# Patient Record
Sex: Male | Born: 1985 | Race: Black or African American | Hispanic: No | Marital: Single | Smoking: Current every day smoker
Health system: Southern US, Community
[De-identification: ages and names within clinical notes are randomized; demographics above are authoritative.]

---

## 2015-02-20 ENCOUNTER — Emergency Department (HOSPITAL_COMMUNITY)
Admission: EM | Admit: 2015-02-20 | Discharge: 2015-02-20 | Disposition: A | Discharge status: Home / Self Care | Payer: BLUE CROSS/BLUE SHIELD | Attending: Emergency Medicine | Admitting: Emergency Medicine

## 2015-02-20 ENCOUNTER — Encounter (HOSPITAL_COMMUNITY): Payer: Self-pay | Admitting: Emergency Medicine

## 2015-02-20 DIAGNOSIS — R000 Tachycardia, unspecified: Secondary | ICD-10-CM | POA: Insufficient documentation

## 2015-02-20 DIAGNOSIS — J069 Acute upper respiratory infection, unspecified: Secondary | ICD-10-CM | POA: Insufficient documentation

## 2015-02-20 DIAGNOSIS — J029 Acute pharyngitis, unspecified: Secondary | ICD-10-CM | POA: Diagnosis present

## 2015-02-20 DIAGNOSIS — Z720 Tobacco use: Secondary | ICD-10-CM | POA: Insufficient documentation

## 2015-02-20 DIAGNOSIS — R6889 Other general symptoms and signs: Secondary | ICD-10-CM

## 2015-02-20 MED ORDER — ACETAMINOPHEN 500 MG PO TABS
1000.0000 mg | ORAL_TABLET | Freq: Once | ORAL | Status: AC
  Administered 2015-02-20: 1000 mg via ORAL
  Filled 2015-02-20: qty 2

## 2015-02-20 MED ORDER — FLUTICASONE PROPIONATE 50 MCG/ACT NA SUSP: 2.0000 | Freq: Every day | NASAL | Status: DC

## 2015-02-20 NOTE — ED Provider Notes (Signed)
CSN: [id number]     Arrival date & time 02/20/15  1048 History   First MD Initiated Contact with Patient 02/20/15 1058     Chief Complaint  Patient presents with  . Cough  . Sore Throat     (Consider location/radiation/quality/duration/timing/severity/associated sxs/prior Treatment) HPI Comments: 29 year old male presenting with productive cough, initially with clear mucus, occasional blood-tinged when he coughs "really hard", sore throat, body aches and pains, and congestion 2 days. Endorses subjective fever and chills at home. Has a decreased appetite. Denies nausea, vomiting or diarrhea. His daughter is sick with similar symptoms and was recently diagnosed with a URI. He has been taking Tylenol for the fevers which have been helping.  Patient is a 29 y.o. male presenting with cough and pharyngitis. The history is provided by the patient.  Cough Associated symptoms: chills, fever, myalgias, rhinorrhea and sore throat   Sore Throat Associated symptoms include arthralgias, chills, congestion, coughing, a fever, myalgias and a sore throat.    History reviewed. No pertinent past medical history. History reviewed. No pertinent past surgical history. History reviewed. No pertinent family history. History  Substance Use Topics  . Smoking status: Current Every Day Smoker  . Smokeless tobacco: Not on file  . Alcohol Use: No    Review of Systems  Constitutional: Positive for fever, chills and appetite change.  HENT: Positive for congestion, rhinorrhea, sinus pressure and sore throat.   Respiratory: Positive for cough.   Musculoskeletal: Positive for myalgias and arthralgias.  All other systems reviewed and are negative.     Allergies  Review of patient's allergies indicates no known allergies.  Home Medications   Prior to Admission medications   Medication Sig Start Date End Date Taking? Authorizing Provider  fluticasone (FLONASE) 50 MCG/ACT nasal spray Place 2 sprays into  both nostrils daily. 02/20/15   Mukesh Kornegay M Harlow Carrizales, PA-C   BP 113/72 mmHg  Pulse 96  Temp(Src) 99 F (37.2 C) (Oral)  Resp 18  SpO2 96% Physical Exam  Constitutional: He is oriented to person, place, and time. He appears well-developed and well-nourished. No distress.  HENT:  Head: Normocephalic and atraumatic.  Right Ear: Tympanic membrane normal.  Left Ear: Tympanic membrane normal.  Nasal congestion, mucosal edema, postnasal drip. No post oropharyngeal edema or exudate. Uvula midline.  Eyes: Conjunctivae and EOM are normal.  Neck: Normal range of motion. Neck supple.  No meningeal signs.  Cardiovascular: Regular rhythm and normal heart sounds.   Mild tachy.  Pulmonary/Chest: Effort normal and breath sounds normal.  Abdominal: Soft. Bowel sounds are normal. He exhibits no distension.  Musculoskeletal: Normal range of motion. He exhibits no edema.  Lymphadenopathy:    He has no cervical adenopathy.  Neurological: He is alert and oriented to person, place, and time.  Skin: Skin is warm and dry.  Psychiatric: He has a normal mood and affect. His behavior is normal.  Nursing note and vitals reviewed.   ED Course  Procedures (including critical care time) Labs Review Labs Reviewed - No data to display  Imaging Review No results found.   EKG Interpretation None      MDM   Final diagnoses:  URI (upper respiratory infection)  Flu-like symptoms   NAD. Non-toxic appearing. Temperature 100.6, mildly tachycardic on arrival, vitals otherwise stable. Lungs clear. No meningeal signs. Tylenol given for fever. Discussed symptomatic treatment. Vitals improved after tylenol. Stable for d/c. Return precautions given. Patient states understanding of treatment care plan and is agreeable.  Kathrynn Speedobyn M Wilbern Pennypacker,  PA-C 02/20/15 1251  Nelva Nay, MD 02/21/15 [phone]

## 2015-02-20 NOTE — Discharge Instructions (Signed)
Use flonase as prescribed. Rest and stay well hydrated.  Cool Mist Vaporizers Vaporizers may help relieve the symptoms of a cough and cold. They add moisture to the air, which helps mucus to become thinner and less sticky. This makes it easier to breathe and cough up secretions. Cool mist vaporizers do not cause serious burns like hot mist vaporizers, which may also be called steamers or humidifiers. Vaporizers have not been proven to help with colds. You should not use a vaporizer if you are allergic to mold. HOME CARE INSTRUCTIONS  Follow the package instructions for the vaporizer.  Do not use anything other than distilled water in the vaporizer.  Do not run the vaporizer all of the time. This can cause mold or bacteria to grow in the vaporizer.  Clean the vaporizer after each time it is used.  Clean and dry the vaporizer well before storing it.  Stop using the vaporizer if worsening respiratory symptoms develop. Document Released: 08/09/2004 Document Revised: 11/17/2013 Document Reviewed: 04/01/2013 [facility] Patient Information 2015 [employer], [place]. This information is not intended to replace advice given to you by your health care provider. Make sure you discuss any questions you have with your health care provider.  Upper Respiratory Infection, Adult An upper respiratory infection (URI) is also sometimes known as the common cold. The upper respiratory tract includes the nose, sinuses, throat, trachea, and bronchi. Bronchi are the airways leading to the lungs. Most people improve within 1 week, but symptoms can last up to 2 weeks. A residual cough may last even longer.  CAUSES Many different viruses can infect the tissues lining the upper respiratory tract. The tissues become irritated and inflamed and often become very moist. Mucus production is also common. A cold is contagious. You can easily spread the virus to others by oral contact. This includes kissing, sharing a glass, coughing,  or sneezing. Touching your mouth or nose and then touching a surface, which is then touched by another person, can also spread the virus. SYMPTOMS  Symptoms typically develop 1 to 3 days after you come in contact with a cold virus. Symptoms vary from person to person. They may include:  Runny nose.  Sneezing.  Nasal congestion.  Sinus irritation.  Sore throat.  Loss of voice (laryngitis).  Cough.  Fatigue.  Muscle aches.  Loss of appetite.  Headache.  Low-grade fever. DIAGNOSIS  You might diagnose your own cold based on familiar symptoms, since most people get a cold 2 to 3 times a year. Your caregiver can confirm this based on your exam. Most importantly, your caregiver can check that your symptoms are not due to another disease such as strep throat, sinusitis, pneumonia, asthma, or epiglottitis. Blood tests, throat tests, and X-rays are not necessary to diagnose a common cold, but they may sometimes be helpful in excluding other more serious diseases. Your caregiver will decide if any further tests are required. RISKS AND COMPLICATIONS  You may be at risk for a more severe case of the common cold if you smoke cigarettes, have chronic heart disease (such as heart failure) or lung disease (such as asthma), or if you have a weakened immune system. The very young and very old are also at risk for more serious infections. Bacterial sinusitis, middle ear infections, and bacterial pneumonia can complicate the common cold. The common cold can worsen asthma and chronic obstructive pulmonary disease (COPD). Sometimes, these complications can require emergency medical care and may be life-threatening. PREVENTION  The best way to  protect against getting a cold is to practice good hygiene. Avoid oral or hand contact with people with cold symptoms. Wash your hands often if contact occurs. There is no clear evidence that vitamin C, vitamin E, echinacea, or exercise reduces the chance of developing  a cold. However, it is always recommended to get plenty of rest and practice good nutrition. TREATMENT  Treatment is directed at relieving symptoms. There is no cure. Antibiotics are not effective, because the infection is caused by a virus, not by bacteria. Treatment may include:  Increased fluid intake. Sports drinks offer valuable electrolytes, sugars, and fluids.  Breathing heated mist or steam (vaporizer or shower).  Eating chicken soup or other clear broths, and maintaining good nutrition.  Getting plenty of rest.  Using gargles or lozenges for comfort.  Controlling fevers with ibuprofen or acetaminophen as directed by your caregiver.  Increasing usage of your inhaler if you have asthma. Zinc gel and zinc lozenges, taken in the first 24 hours of the common cold, can shorten the duration and lessen the severity of symptoms. Pain medicines may help with fever, muscle aches, and throat pain. A variety of non-prescription medicines are available to treat congestion and runny nose. Your caregiver can make recommendations and may suggest nasal or lung inhalers for other symptoms.  HOME CARE INSTRUCTIONS   Only take over-the-counter or prescription medicines for pain, discomfort, or fever as directed by your caregiver.  Use a warm mist humidifier or inhale steam from a shower to increase air moisture. This may keep secretions moist and make it easier to breathe.  Drink enough water and fluids to keep your urine clear or pale yellow.  Rest as needed.  Return to work when your temperature has returned to normal or as your caregiver advises. You may need to stay home longer to avoid infecting others. You can also use a face mask and careful hand washing to prevent spread of the virus. SEEK MEDICAL CARE IF:   After the first few days, you feel you are getting worse rather than better.  You need your caregiver's advice about medicines to control symptoms.  You develop chills, worsening  shortness of breath, or brown or red sputum. These may be signs of pneumonia.  You develop yellow or brown nasal discharge or pain in the face, especially when you bend forward. These may be signs of sinusitis.  You develop a fever, swollen neck glands, pain with swallowing, or white areas in the back of your throat. These may be signs of strep throat. SEEK IMMEDIATE MEDICAL CARE IF:   You have a fever.  You develop severe or persistent headache, ear pain, sinus pain, or chest pain.  You develop wheezing, a prolonged cough, cough up blood, or have a change in your usual mucus (if you have chronic lung disease).  You develop sore muscles or a stiff neck. Document Released: 05/08/2001 Document Revised: 02/04/2012 Document Reviewed: 02/17/2014 [facility] Patient Information 2015 [place], [place]. This information is not intended to replace advice given to you by your health care provider. Make sure you discuss any questions you have with your health care provider.  Influenza Influenza ("the flu") is a viral infection of the respiratory tract. It occurs more often in winter months because people spend more time in close contact with one another. Influenza can make you feel very sick. Influenza easily spreads from person to person (contagious). CAUSES  Influenza is caused by a virus that infects the respiratory tract. You can catch  the virus by breathing in droplets from an infected person's cough or sneeze. You can also catch the virus by touching something that was recently contaminated with the virus and then touching your mouth, nose, or eyes. RISKS AND COMPLICATIONS You may be at risk for a more severe case of influenza if you smoke cigarettes, have diabetes, have chronic heart disease (such as heart failure) or lung disease (such as asthma), or if you have a weakened immune system. Elderly people and pregnant women are also at risk for more serious infections. The most common problem of influenza  is a lung infection (pneumonia). Sometimes, this problem can require emergency medical care and may be life threatening. SIGNS AND SYMPTOMS  Symptoms typically last 4 to 10 days and may include:  Fever.  Chills.  Headache, body aches, and muscle aches.  Sore throat.  Chest discomfort and cough.  Poor appetite.  Weakness or feeling tired.  Dizziness.  Nausea or vomiting. DIAGNOSIS  Diagnosis of influenza is often made based on your history and a physical exam. A nose or throat swab test can be done to confirm the diagnosis. TREATMENT  In mild cases, influenza goes away on its own. Treatment is directed at relieving symptoms. For more severe cases, your health care provider may prescribe antiviral medicines to shorten the sickness. Antibiotic medicines are not effective because the infection is caused by a virus, not by bacteria. HOME CARE INSTRUCTIONS  Take medicines only as directed by your health care provider.  Use a cool mist humidifier to make breathing easier.  Get plenty of rest until your temperature returns to normal. This usually takes 3 to 4 days.  Drink enough fluid to keep your urine clear or pale yellow.  Cover yourmouth and nosewhen coughing or sneezing,and wash your handswellto prevent thevirusfrom spreading.  Stay homefromwork orschool untilthe fever is gonefor at least 13full day. PREVENTION  An annual influenza vaccination (flu shot) is the best way to avoid getting influenza. An annual flu shot is now routinely recommended for all adults in the [place] SEEK MEDICAL CARE IF:  You experiencechest pain, yourcough worsens,or you producemore mucus.  Youhave nausea,vomiting, ordiarrhea.  Your fever returns or gets worse. SEEK IMMEDIATE MEDICAL CARE IF:  You havetrouble breathing, you become short of breath,or your skin ornails becomebluish.  You have severe painor stiffnessin the neck.  You develop a sudden headache, or pain in  the face or ear.  You have nausea or vomiting that you cannot control. MAKE SURE YOU:   Understand these instructions.  Will watch your condition.  Will get help right away if you are not doing well or get worse. Document Released: 11/09/2000 Document Revised: 03/29/2014 Document Reviewed: 02/11/2012 [facility] Patient Information 2015 [place], [place]. This information is not intended to replace advice given to you by your health care provider. Make sure you discuss any questions you have with your health care provider.

## 2015-02-20 NOTE — ED Notes (Signed)
Pt c/o cough, sore throat, body aches since Sunday.  Subjective fever at home.

## 2017-01-16 ENCOUNTER — Encounter: Payer: BLUE CROSS/BLUE SHIELD | Admitting: Podiatry

## 2017-03-25 NOTE — Progress Notes (Signed)
This encounter was created in error - please disregard.

## 2019-12-10 ENCOUNTER — Other Ambulatory Visit: Payer: Self-pay

## 2019-12-10 ENCOUNTER — Ambulatory Visit (HOSPITAL_COMMUNITY)
Admission: EM | Admit: 2019-12-10 | Discharge: 2019-12-10 | Disposition: A | Discharge status: Home / Self Care | Payer: BC Managed Care – PPO | Attending: Internal Medicine | Admitting: Internal Medicine

## 2019-12-10 ENCOUNTER — Encounter (HOSPITAL_COMMUNITY): Payer: Self-pay

## 2019-12-10 DIAGNOSIS — Z9189 Other specified personal risk factors, not elsewhere classified: Secondary | ICD-10-CM

## 2019-12-10 DIAGNOSIS — Z20822 Contact with and (suspected) exposure to covid-19: Secondary | ICD-10-CM | POA: Insufficient documentation

## 2019-12-10 DIAGNOSIS — Z20828 Contact with and (suspected) exposure to other viral communicable diseases: Secondary | ICD-10-CM

## 2019-12-10 DIAGNOSIS — Z79899 Other long term (current) drug therapy: Secondary | ICD-10-CM | POA: Insufficient documentation

## 2019-12-10 DIAGNOSIS — F17200 Nicotine dependence, unspecified, uncomplicated: Secondary | ICD-10-CM | POA: Insufficient documentation

## 2019-12-10 NOTE — ED Triage Notes (Signed)
Pt presents for covid testing after possible exposure: pt states his mom is symptomatic and has pending test results so his job is making him get tested and has him out of work pending test results.

## 2019-12-10 NOTE — ED Provider Notes (Signed)
[place]    CSN: [id number] Arrival date & time: 12/10/19  0855      History   Chief Complaint Chief Complaint  Patient presents with  . Covid Exposure    HPI Troy Shields is a 34 y.o. male with no past medical history comes to urgent care for COVID-19 testing.  Patient's mother developed the loss of sense of smell and taste.  She is currently being investigated for COVID-19 infection.  Patient has no symptoms.   HPI  History reviewed. No pertinent past medical history.  There are no problems to display for this patient.   History reviewed. No pertinent surgical history.     Home Medications    Prior to Admission medications   Medication Sig Start Date End Date Taking? Authorizing Provider  fluticasone (FLONASE) 50 MCG/ACT nasal spray Place 2 sprays into both nostrils daily. 02/20/15   Hess, Hessie Diener, PA-C    Family History Family History  Family history unknown: Yes    Social History Social History   Tobacco Use  . Smoking status: Current Every Day Smoker  Substance Use Topics  . Alcohol use: No  . Drug use: No     Allergies   Patient has no known allergies.   Review of Systems Review of Systems  Constitutional: Negative for appetite change and fever.  Respiratory: Negative for cough and shortness of breath.   Musculoskeletal: Negative for arthralgias, joint swelling and myalgias.  Skin: Negative.      Physical Exam Triage Vital Signs ED Triage Vitals  Enc Vitals Group     BP 12/10/19 0914 (!) 147/96     Pulse Rate 12/10/19 0914 68     Resp 12/10/19 0914 18     Temp 12/10/19 0914 98 F (36.7 C)     Temp Source 12/10/19 0914 Oral     SpO2 12/10/19 0914 100 %     Weight --      Height --      Head Circumference --      Peak Flow --      Pain Score 12/10/19 0916 0     Pain Loc --      Pain Edu? --      Excl. in [place]? --    No data found.  Updated Vital Signs BP (!) 147/96 (BP Location: Left Arm)   Pulse 68   Temp 98  F (36.7 C) (Oral)   Resp 18   SpO2 100%   Visual Acuity Right Eye Distance:   Left Eye Distance:   Bilateral Distance:    Right Eye Near:   Left Eye Near:    Bilateral Near:     Physical Exam Vitals and nursing note reviewed.  Constitutional:      Appearance: Normal appearance.  Cardiovascular:     Rate and Rhythm: Normal rate and regular rhythm.     Pulses: Normal pulses.     Heart sounds: Normal heart sounds.  Neurological:     Mental Status: He is alert.      UC Treatments / Results  Labs (all labs ordered are listed, but only abnormal results are displayed) Labs Reviewed  NOVEL CORONAVIRUS, NAA (HOSP ORDER, SEND-OUT TO REF LAB; TAT 18-24 HRS)    EKG   Radiology No results found.  Procedures Procedures (including critical care time)  Medications Ordered in UC Medications - No data to display  Initial Impression / Assessment and Plan / UC Course  I have reviewed  the triage vital signs and the nursing notes.  Pertinent labs & imaging results that were available during my care of the patient were reviewed by me and considered in my medical decision making (see chart for details).     1.  Increased risk of exposure to COVID-19 virus: COVID-19 PCR testing sent Patient is encouraged to download MyChart to access his lab results Self isolate until COVID-19 test results are available Continue wearing a mask, respiratory etiquette and  hygiene measures, social distance. Final Clinical Impressions(s) / UC Diagnoses   Final diagnoses:  At increased risk of exposure to COVID-19 virus   Discharge Instructions   None    ED Prescriptions    None     PDMP not reviewed this encounter.   Merrilee Jansky, MD 12/10/19 1000

## 2019-12-12 LAB — NOVEL CORONAVIRUS, NAA (HOSP ORDER, SEND-OUT TO REF LAB; TAT 18-24 HRS): SARS-CoV-2, NAA: NOT DETECTED

## 2019-12-22 ENCOUNTER — Other Ambulatory Visit: Payer: Self-pay

## 2019-12-22 ENCOUNTER — Ambulatory Visit (HOSPITAL_COMMUNITY)
Admission: EM | Admit: 2019-12-22 | Discharge: 2019-12-22 | Disposition: A | Discharge status: Home / Self Care | Payer: BC Managed Care – PPO | Attending: Family Medicine | Admitting: Family Medicine

## 2019-12-22 ENCOUNTER — Encounter (HOSPITAL_COMMUNITY): Payer: Self-pay | Admitting: Emergency Medicine

## 2019-12-22 DIAGNOSIS — Z20822 Contact with and (suspected) exposure to covid-19: Secondary | ICD-10-CM | POA: Insufficient documentation

## 2019-12-22 DIAGNOSIS — R0989 Other specified symptoms and signs involving the circulatory and respiratory systems: Secondary | ICD-10-CM

## 2019-12-22 DIAGNOSIS — R05 Cough: Secondary | ICD-10-CM | POA: Insufficient documentation

## 2019-12-22 DIAGNOSIS — F17200 Nicotine dependence, unspecified, uncomplicated: Secondary | ICD-10-CM | POA: Diagnosis present

## 2019-12-22 DIAGNOSIS — R059 Cough, unspecified: Secondary | ICD-10-CM

## 2019-12-22 DIAGNOSIS — R0789 Other chest pain: Secondary | ICD-10-CM | POA: Insufficient documentation

## 2019-12-22 MED ORDER — PROMETHAZINE-DM 6.25-15 MG/5ML PO SYRP: 5.0000 mL | ORAL_SOLUTION | Freq: Every evening | ORAL | 0 refills | Status: DC | PRN

## 2019-12-22 MED ORDER — BENZONATATE 100 MG PO CAPS: 100.0000 mg | ORAL_CAPSULE | Freq: Three times a day (TID) | ORAL | 0 refills | Status: DC | PRN

## 2019-12-22 MED ORDER — ALBUTEROL SULFATE HFA 108 (90 BASE) MCG/ACT IN AERS: 1.0000 | INHALATION_SPRAY | Freq: Four times a day (QID) | RESPIRATORY_TRACT | 0 refills | Status: DC | PRN

## 2019-12-22 NOTE — ED Provider Notes (Signed)
  [facility]   MRN: [id number] DOB: 06/10/1986  Subjective:   Troy Shields is a 34 y.o. male presenting for 2-3 day history of acute onset persistent moderate malaise, chest congestion and heaviness. Patient smokes heavily, chain smokes. He has had COVID exposure through his mother, lives with her. She tested positive ~2 weeks ago. He tested at the same time and was negative. Currently, patient's employer has him out until 12/28/2019.   No current facility-administered medications for this encounter.  Current Outpatient Medications:  .  fluticasone (FLONASE) 50 MCG/ACT nasal spray, Place 2 sprays into both nostrils daily., Disp: 16 g, Rfl: 0   No Known Allergies  History reviewed. No pertinent past medical history.   History reviewed. No pertinent surgical history.  Family History  Family history unknown: Yes    Social History   Tobacco Use  . Smoking status: Current Every Day Smoker  . Smokeless tobacco: Never Used  Substance Use Topics  . Alcohol use: No  . Drug use: No    ROS   Objective:   Vitals: BP (!) 125/96 (BP Location: Right Arm)   Pulse 82   Temp 98.4 F (36.9 C) (Oral)   Resp 18   SpO2 97%   Physical Exam Constitutional:      General: He is not in acute distress.    Appearance: Normal appearance. He is well-developed. He is not ill-appearing, toxic-appearing or diaphoretic.  HENT:     Head: Normocephalic and atraumatic.     Right Ear: External ear normal.     Left Ear: External ear normal.     Nose: Nose normal.     Mouth/Throat:     Mouth: Mucous membranes are moist.     Pharynx: Oropharynx is clear.  Eyes:     General: No scleral icterus.    Extraocular Movements: Extraocular movements intact.     Pupils: Pupils are equal, round, and reactive to light.  Cardiovascular:     Rate and Rhythm: Normal rate and regular rhythm.     Heart sounds: Normal heart sounds. No murmur. No friction rub. No gallop.   Pulmonary:     Effort:  Pulmonary effort is normal. No respiratory distress.     Breath sounds: Normal breath sounds. No stridor. No wheezing, rhonchi or rales.  Neurological:     Mental Status: He is alert and oriented to person, place, and time.  Psychiatric:        Mood and Affect: Mood normal.        Behavior: Behavior normal.        Thought Content: Thought content normal.        Judgment: Judgment normal.     Assessment and Plan :   1. Chest congestion   2. Chest heaviness   3. Close exposure to COVID-19 virus   4. Cough   5. Chain smoker     Will manage for viral illness such as viral URI, viral rhinitis, possible COVID-19. Counseled patient on nature of COVID-19 including modes of transmission, diagnostic testing, management and supportive care.  Rx for cough suppression medications, albuterol inhaler. Hold off on smoking. COVID 19 testing is pending. Will extend quarantine period to 12/30/2019 if COVID 19 testing is positive. Counseled patient on potential for adverse effects with medications prescribed/recommended today, ER and return-to-clinic precautions discussed, patient verbalized understanding.     Wallis Bamberg, PA-C 12/22/19 1850

## 2019-12-22 NOTE — Discharge Instructions (Addendum)

## 2019-12-22 NOTE — ED Triage Notes (Signed)
Pt here with some nasal congestion and chest congestion; pt sts mother had covid recently and he was tested and was negative

## 2019-12-24 LAB — NOVEL CORONAVIRUS, NAA (HOSP ORDER, SEND-OUT TO REF LAB; TAT 18-24 HRS): SARS-CoV-2, NAA: NOT DETECTED

## 2020-03-02 ENCOUNTER — Ambulatory Visit (INDEPENDENT_AMBULATORY_CARE_PROVIDER_SITE_OTHER): Payer: BC Managed Care – PPO

## 2020-03-02 ENCOUNTER — Other Ambulatory Visit: Payer: Self-pay

## 2020-03-02 ENCOUNTER — Encounter (HOSPITAL_COMMUNITY): Payer: Self-pay

## 2020-03-02 ENCOUNTER — Ambulatory Visit (HOSPITAL_COMMUNITY)
Admission: EM | Admit: 2020-03-02 | Discharge: 2020-03-02 | Disposition: A | Discharge status: Home / Self Care | Payer: BC Managed Care – PPO | Attending: Family Medicine | Admitting: Family Medicine

## 2020-03-02 DIAGNOSIS — R05 Cough: Secondary | ICD-10-CM

## 2020-03-02 DIAGNOSIS — R531 Weakness: Secondary | ICD-10-CM

## 2020-03-02 DIAGNOSIS — R059 Cough, unspecified: Secondary | ICD-10-CM

## 2020-03-02 DIAGNOSIS — Z20822 Contact with and (suspected) exposure to covid-19: Secondary | ICD-10-CM | POA: Diagnosis not present

## 2020-03-02 DIAGNOSIS — Z79899 Other long term (current) drug therapy: Secondary | ICD-10-CM | POA: Diagnosis not present

## 2020-03-02 DIAGNOSIS — J3489 Other specified disorders of nose and nasal sinuses: Secondary | ICD-10-CM | POA: Insufficient documentation

## 2020-03-02 DIAGNOSIS — R062 Wheezing: Secondary | ICD-10-CM | POA: Insufficient documentation

## 2020-03-02 DIAGNOSIS — F17210 Nicotine dependence, cigarettes, uncomplicated: Secondary | ICD-10-CM | POA: Diagnosis not present

## 2020-03-02 DIAGNOSIS — R5383 Other fatigue: Secondary | ICD-10-CM | POA: Insufficient documentation

## 2020-03-02 DIAGNOSIS — J069 Acute upper respiratory infection, unspecified: Secondary | ICD-10-CM | POA: Diagnosis not present

## 2020-03-02 MED ORDER — ALBUTEROL SULFATE HFA 108 (90 BASE) MCG/ACT IN AERS: 1.0000 | INHALATION_SPRAY | Freq: Four times a day (QID) | RESPIRATORY_TRACT | 0 refills | Status: DC | PRN

## 2020-03-02 NOTE — ED Triage Notes (Signed)
Pt c/o dry cough when laying down, but sometimes has productive cough w/yellow/green mucous, runny nose, weaknessx1 wk. Pt did telehealth visit w/PCP and they feel like he has a lung infection and prescribed him an inhaler and meds, but told him to get a COVID test.

## 2020-03-02 NOTE — ED Provider Notes (Signed)
[place]    CSN: [id number] Arrival date & time: 03/02/20  1336      History   Chief Complaint Chief Complaint  Patient presents with  . COVID symptoms    HPI Troy Shields is a 34 y.o. male.   Patient reports that he has been having a cough, fatigue, runny nose, weakness for the last 7 days.  Reports that he did a televisit this morning, and that they recommended that he come in to get a Covid test.  Patient has already been prescribed doxycycline, cough syrup, Tessalon Perles, albuterol inhaler, steroid taper Dosepak from previous televisit.  Otherwise, patient takes no daily medications.  Upon chart review, patient has no significant medical history.  Denies headache, shortness of breath, body aches, chills, nausea, vomiting, diarrhea, rash, fever, other symptoms.  ROS per HPI  The history is provided by the patient.    History reviewed. No pertinent past medical history.  There are no problems to display for this patient.   History reviewed. No pertinent surgical history.     Home Medications    Prior to Admission medications   Medication Sig Start Date End Date Taking? Authorizing Provider  albuterol (VENTOLIN HFA) 108 (90 Base) MCG/ACT inhaler Inhale 1-2 puffs into the lungs every 6 (six) hours as needed for wheezing or shortness of breath. 03/02/20   Faustino Congress, NP  benzonatate (TESSALON) 100 MG capsule Take 1-2 capsules (100-200 mg total) by mouth 3 (three) times daily as needed. 12/22/19   Jaynee Eagles, PA-C  doxycycline (VIBRAMYCIN) 100 MG capsule Take 100 mg by mouth 2 (two) times daily. 02/29/20   [provider]  fluticasone (FLONASE) 50 MCG/ACT nasal spray Place 2 sprays into both nostrils daily. 02/20/15   Hess, Hessie Diener, PA-C  methylPREDNISolone (MEDROL) 4 MG tablet Take 4 mg by mouth as directed. 02/29/20   [provider]  promethazine-dextromethorphan (PROMETHAZINE-DM) 6.25-15 MG/5ML syrup Take 5 mLs by mouth at bedtime as  needed for cough. 12/22/19   Jaynee Eagles, PA-C    Family History Family History  Family history unknown: Yes    Social History Social History   Tobacco Use  . Smoking status: Current Every Day Smoker    Packs/day: 0.50    Types: Cigarettes  . Smokeless tobacco: Never Used  Substance Use Topics  . Alcohol use: Yes    Comment: occ  . Drug use: No     Allergies   Patient has no known allergies.   Review of Systems Review of Systems   Physical Exam Triage Vital Signs ED Triage Vitals  Enc Vitals Group     BP 03/02/20 1358 (!) 142/85     Pulse Rate 03/02/20 1358 67     Resp 03/02/20 1358 16     Temp 03/02/20 1358 98.1 F (36.7 C)     Temp Source 03/02/20 1358 Oral     SpO2 03/02/20 1358 100 %     Weight 03/02/20 1359 150 lb (68 kg)     Height 03/02/20 1359 6\' 1"  (1.854 m)     Head Circumference --      Peak Flow --      Pain Score 03/02/20 1359 0     Pain Loc --      Pain Edu? --      Excl. in [place]? --    No data found.  Updated Vital Signs BP (!) 142/85   Pulse 67   Temp 98.1 F (36.7 C) (Oral)  Resp 16   Ht 6\' 1"  (1.854 m)   Wt 150 lb (68 kg)   SpO2 100%   BMI 19.79 kg/m   Visual Acuity Right Eye Distance:   Left Eye Distance:   Bilateral Distance:    Right Eye Near:   Left Eye Near:    Bilateral Near:     Physical Exam Vitals and nursing note reviewed.  Constitutional:      Appearance: Normal appearance. He is well-developed and normal weight. He is ill-appearing.  HENT:     Head: Normocephalic and atraumatic.     Right Ear: Tympanic membrane normal.     Left Ear: Tympanic membrane normal.     Nose: Rhinorrhea present.     Mouth/Throat:     Mouth: Mucous membranes are moist.     Pharynx: Oropharynx is clear.  Eyes:     Conjunctiva/sclera: Conjunctivae normal.  Cardiovascular:     Rate and Rhythm: Normal rate and regular rhythm.     Heart sounds: Normal heart sounds. No murmur.  Pulmonary:     Effort: Pulmonary effort is normal.  No respiratory distress.     Breath sounds: No stridor. Examination of the right-upper field reveals wheezing. Examination of the left-upper field reveals wheezing. Examination of the right-middle field reveals wheezing. Examination of the left-middle field reveals wheezing. Examination of the right-lower field reveals decreased breath sounds. Examination of the left-lower field reveals decreased breath sounds. Decreased breath sounds and wheezing present. No rhonchi or rales.  Chest:     Chest wall: No tenderness.  Abdominal:     General: Bowel sounds are normal. There is no distension.     Palpations: Abdomen is soft. There is no mass.     Tenderness: There is no abdominal tenderness. There is no right CVA tenderness, left CVA tenderness, guarding or rebound.     Hernia: No hernia is present.  Musculoskeletal:        General: Normal range of motion.     Cervical back: Normal range of motion and neck supple.  Lymphadenopathy:     Cervical: Cervical adenopathy (Bilateral) present.  Skin:    General: Skin is warm and dry.     Capillary Refill: Capillary refill takes less than 2 seconds.  Neurological:     General: No focal deficit present.     Mental Status: He is alert and oriented to person, place, and time.  Psychiatric:        Mood and Affect: Mood normal.        Behavior: Behavior normal.        Thought Content: Thought content normal.      UC Treatments / Results  Labs (all labs ordered are listed, but only abnormal results are displayed) Labs Reviewed  SARS CORONAVIRUS 2 (TAT 6-24 HRS)    EKG   Radiology No results found.  Procedures Procedures (including critical care time)  Medications Ordered in UC Medications - No data to display  Initial Impression / Assessment and Plan / UC Course  I have reviewed the triage vital signs and the nursing notes.  Pertinent labs & imaging results that were available during my care of the patient were reviewed by me and  considered in my medical decision making (see chart for details).     Acute upper respiratory infection: Presents with cough, fatigue, weakness, rhinorrhea for the last 7 days.  Patient has been treated with doxycycline 100 mg twice daily x7 days also was prescribed cough syrup.  Upon lung auscultation, wheezing noted in bilateral upper and mid lung fields, decreased breath sounds noted in bilateral bases of lung fields.  These were prescribed via televisit that the patient had this morning.  Chest x-ray today was negative for signs of pneumonia or other cardiovascular or pulmonary abnormalities.  Albuterol inhaler sent to patient's pharmacy for him to use 2 puffs every 4-6 hours as needed for wheezing.  Covid swab obtained in office today.  Instructed patient to quarantine until results are back and negative.  Instructed patient negative results will be available on his MyChart account.  Instructed patient that if his results are positive, and any other treatment is needed he will be contacted by telehealth.  He is also instructed that if he is having trouble swallowing, trouble breathing, high fever, other concerning symptoms that he is to report to the ER for further evaluation and treatment. Final Clinical Impressions(s) / UC Diagnoses   Final diagnoses:  Cough  Other fatigue  Acute upper respiratory infection  Weakness  Rhinorrhea     Discharge Instructions     Your COVID test is pending.  You should self quarantine until the test result is back.    Take Tylenol as needed for fever or discomfort.  Rest and keep yourself hydrated.    Go to the emergency department if you develop shortness of breath, severe diarrhea, high fever not relieved by Tylenol or ibuprofen, or other concerning symptoms.       ED Prescriptions    Medication Sig Dispense Auth. Provider   albuterol (VENTOLIN HFA) 108 (90 Base) MCG/ACT inhaler Inhale 1-2 puffs into the lungs every 6 (six) hours as needed for  wheezing or shortness of breath. 18 g Moshe Cipro, NP     PDMP not reviewed this encounter.   Moshe Cipro, NP 03/04/20 1800

## 2020-03-02 NOTE — Discharge Instructions (Addendum)
Your COVID test is pending.  You should self quarantine until the test result is back.    Take Tylenol as needed for fever or discomfort.  Rest and keep yourself hydrated.    Go to the emergency department if you develop shortness of breath, severe diarrhea, high fever not relieved by Tylenol or ibuprofen, or other concerning symptoms.    

## 2020-03-03 LAB — SARS CORONAVIRUS 2 (TAT 6-24 HRS): SARS Coronavirus 2: NEGATIVE

## 2020-03-06 ENCOUNTER — Ambulatory Visit (HOSPITAL_COMMUNITY)
Admission: EM | Admit: 2020-03-06 | Discharge: 2020-03-06 | Discharge status: Absent without leave | Payer: BC Managed Care – PPO

## 2020-03-06 NOTE — ED Triage Notes (Signed)
Pt just needed work note updated did not need to be seen.

## 2020-07-05 ENCOUNTER — Ambulatory Visit (HOSPITAL_COMMUNITY)
Admission: EM | Admit: 2020-07-05 | Discharge: 2020-07-05 | Disposition: A | Discharge status: Home / Self Care | Payer: BC Managed Care – PPO | Attending: Urgent Care | Admitting: Urgent Care

## 2020-07-05 ENCOUNTER — Encounter (HOSPITAL_COMMUNITY): Payer: Self-pay

## 2020-07-05 ENCOUNTER — Other Ambulatory Visit: Payer: Self-pay

## 2020-07-05 DIAGNOSIS — F17200 Nicotine dependence, unspecified, uncomplicated: Secondary | ICD-10-CM | POA: Insufficient documentation

## 2020-07-05 DIAGNOSIS — R05 Cough: Secondary | ICD-10-CM | POA: Insufficient documentation

## 2020-07-05 DIAGNOSIS — R053 Chronic cough: Secondary | ICD-10-CM

## 2020-07-05 DIAGNOSIS — Z20822 Contact with and (suspected) exposure to covid-19: Secondary | ICD-10-CM | POA: Insufficient documentation

## 2020-07-05 DIAGNOSIS — R058 Other specified cough: Secondary | ICD-10-CM

## 2020-07-05 LAB — SARS CORONAVIRUS 2 (TAT 6-24 HRS): SARS Coronavirus 2: NEGATIVE

## 2020-07-05 MED ORDER — ALBUTEROL SULFATE HFA 108 (90 BASE) MCG/ACT IN AERS: 1.0000 | INHALATION_SPRAY | Freq: Four times a day (QID) | RESPIRATORY_TRACT | 0 refills | Status: DC | PRN

## 2020-07-05 MED ORDER — CETIRIZINE HCL 10 MG PO TABS: 10.0000 mg | ORAL_TABLET | Freq: Every day | ORAL | 0 refills | Status: DC

## 2020-07-05 MED ORDER — FLUTICASONE PROPIONATE 50 MCG/ACT NA SUSP: 2.0000 | Freq: Every day | NASAL | 0 refills | Status: DC

## 2020-07-05 MED ORDER — BENZONATATE 100 MG PO CAPS: 100.0000 mg | ORAL_CAPSULE | Freq: Three times a day (TID) | ORAL | 0 refills | Status: DC | PRN

## 2020-07-05 NOTE — ED Triage Notes (Addendum)
Pt reports he was exposed to a person with COVID 1 week ago. Pt state shaving cough x 1 year.

## 2020-07-05 NOTE — Discharge Instructions (Signed)

## 2020-07-05 NOTE — ED Provider Notes (Signed)
[facility]   MRN: [id number] DOB: 08/28/1986  Subjective:   Troy Shields is a 34 y.o. male presenting for Covid exposure in the past week.  Patient states that he generally has a cough.  Smokes heavily, does change smoking.  Has previously had to use an albuterol inhaler for wheezing and would like a refill of this.  He is also used allergy medications but he ran out.  No current facility-administered medications for this encounter.  Current Outpatient Medications:    albuterol (VENTOLIN HFA) 108 (90 Base) MCG/ACT inhaler, Inhale 1-2 puffs into the lungs every 6 (six) hours as needed for wheezing or shortness of breath., Disp: 18 g, Rfl: 0   benzonatate (TESSALON) 100 MG capsule, Take 1-2 capsules (100-200 mg total) by mouth 3 (three) times daily as needed., Disp: 60 capsule, Rfl: 0   doxycycline (VIBRAMYCIN) 100 MG capsule, Take 100 mg by mouth 2 (two) times daily., Disp: , Rfl:    fluticasone (FLONASE) 50 MCG/ACT nasal spray, Place 2 sprays into both nostrils daily., Disp: 16 g, Rfl: 0   methylPREDNISolone (MEDROL) 4 MG tablet, Take 4 mg by mouth as directed., Disp: , Rfl:    promethazine-dextromethorphan (PROMETHAZINE-DM) 6.25-15 MG/5ML syrup, Take 5 mLs by mouth at bedtime as needed for cough., Disp: 100 mL, Rfl: 0   No Known Allergies  History reviewed. No pertinent past medical history.   History reviewed. No pertinent surgical history.  Family History  Family history unknown: Yes    Social History   Tobacco Use   Smoking status: Current Every Day Smoker    Packs/day: 0.50    Types: Cigarettes   Smokeless tobacco: Never Used  Substance Use Topics   Alcohol use: Yes    Comment: occ   Drug use: No    Review of Systems  Constitutional: Negative for fever and malaise/fatigue.  HENT: Negative for congestion, ear pain, sinus pain and sore throat.   Eyes: Negative for discharge and redness.  Respiratory: Positive for cough and wheezing. Negative  for hemoptysis and shortness of breath.   Cardiovascular: Negative for chest pain.  Gastrointestinal: Negative for abdominal pain, diarrhea, nausea and vomiting.  Genitourinary: Negative for dysuria, flank pain and hematuria.  Musculoskeletal: Negative for myalgias.  Skin: Negative for rash.  Neurological: Negative for dizziness, weakness and headaches.  Psychiatric/Behavioral: Negative for depression and substance abuse.   Denies loss of sense of taste and smell.  Objective:   Vitals: BP 128/89 (BP Location: Left Arm)    Pulse 76    Temp 98.4 F (36.9 C) (Oral)    Resp 17    SpO2 100%   Physical Exam Constitutional:      General: He is not in acute distress.    Appearance: Normal appearance. He is well-developed. He is not ill-appearing, toxic-appearing or diaphoretic.  HENT:     Head: Normocephalic and atraumatic.     Right Ear: External ear normal.     Left Ear: External ear normal.     Nose: Nose normal.     Mouth/Throat:     Mouth: Mucous membranes are moist.     Pharynx: Oropharynx is clear.  Eyes:     General: No scleral icterus.    Extraocular Movements: Extraocular movements intact.     Pupils: Pupils are equal, round, and reactive to light.  Cardiovascular:     Rate and Rhythm: Normal rate and regular rhythm.     Heart sounds: Normal heart sounds. No murmur heard.  No friction rub. No gallop.   Pulmonary:     Effort: Pulmonary effort is normal. No respiratory distress.     Breath sounds: Normal breath sounds. No stridor. No wheezing, rhonchi or rales.  Neurological:     Mental Status: He is alert and oriented to person, place, and time.  Psychiatric:        Mood and Affect: Mood normal.        Behavior: Behavior normal.        Thought Content: Thought content normal.       Assessment and Plan :   PDMP not reviewed this encounter.  1. Cough with exposure to COVID-19 virus   2. Chronic cough   3. Smoker     COVID-19 testing pending.  Counseled  patient on risks of smoking, encouraged him to quit smoking.  Refilled his albuterol inhaler, also use supportive medications in light of his smoking.  Patient did not want his work note today.  He prefers to see test results first and then come in for his note. Counseled patient on potential for adverse effects with medications prescribed/recommended today, ER and return-to-clinic precautions discussed, patient verbalized understanding.    Wallis Bamberg, [place] 07/05/20 [phone]

## 2020-08-31 ENCOUNTER — Ambulatory Visit (HOSPITAL_COMMUNITY)
Admission: EM | Admit: 2020-08-31 | Discharge: 2020-08-31 | Discharge status: Against Medical Advice | Payer: BC Managed Care – PPO

## 2020-08-31 ENCOUNTER — Other Ambulatory Visit: Payer: Self-pay

## 2020-08-31 NOTE — ED Notes (Signed)
Patient called in the lobby x 1, no answer.

## 2020-08-31 NOTE — ED Notes (Signed)
No answer in lobby.

## 2021-01-27 IMAGING — DX DG CHEST 2V
2 series · 2 of 2 positions shown · non-contrast
Comparison: None.

CLINICAL DATA: 33-year-old male with productive cough and weakness.

EXAM:
CHEST - 2 VIEW

[chest pa]
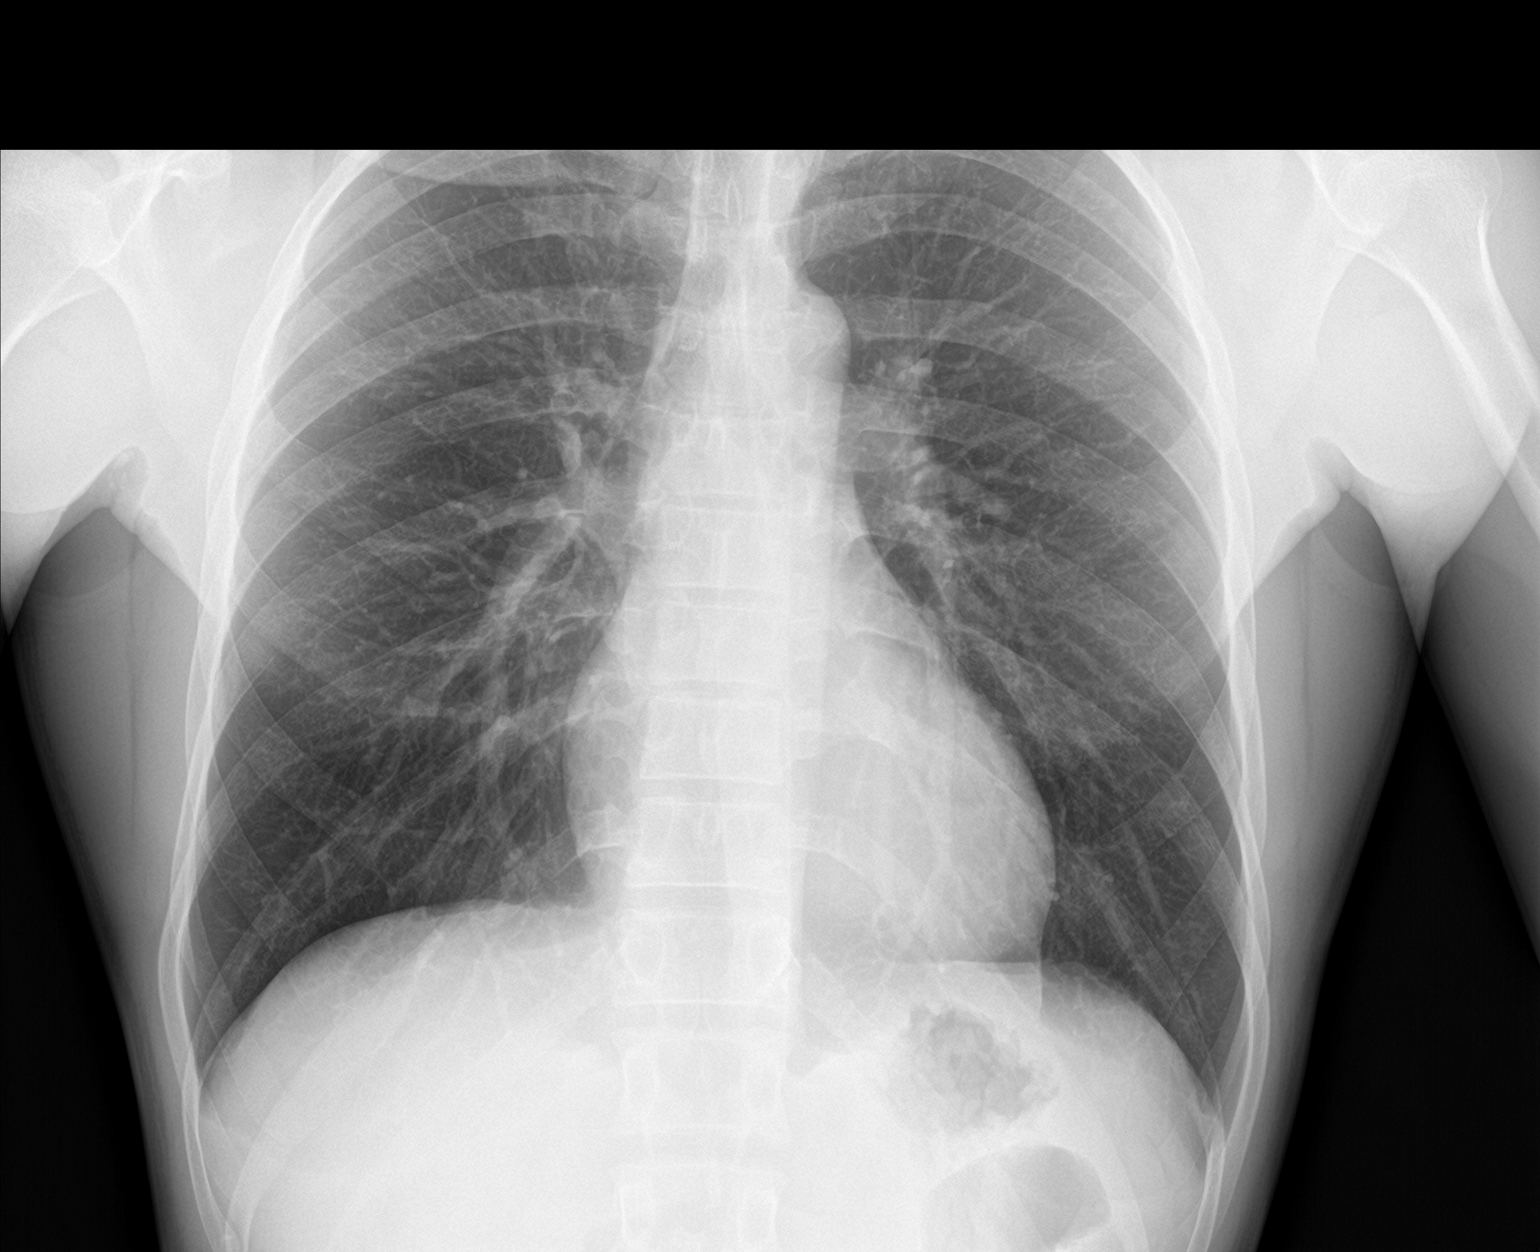

[chest lat]
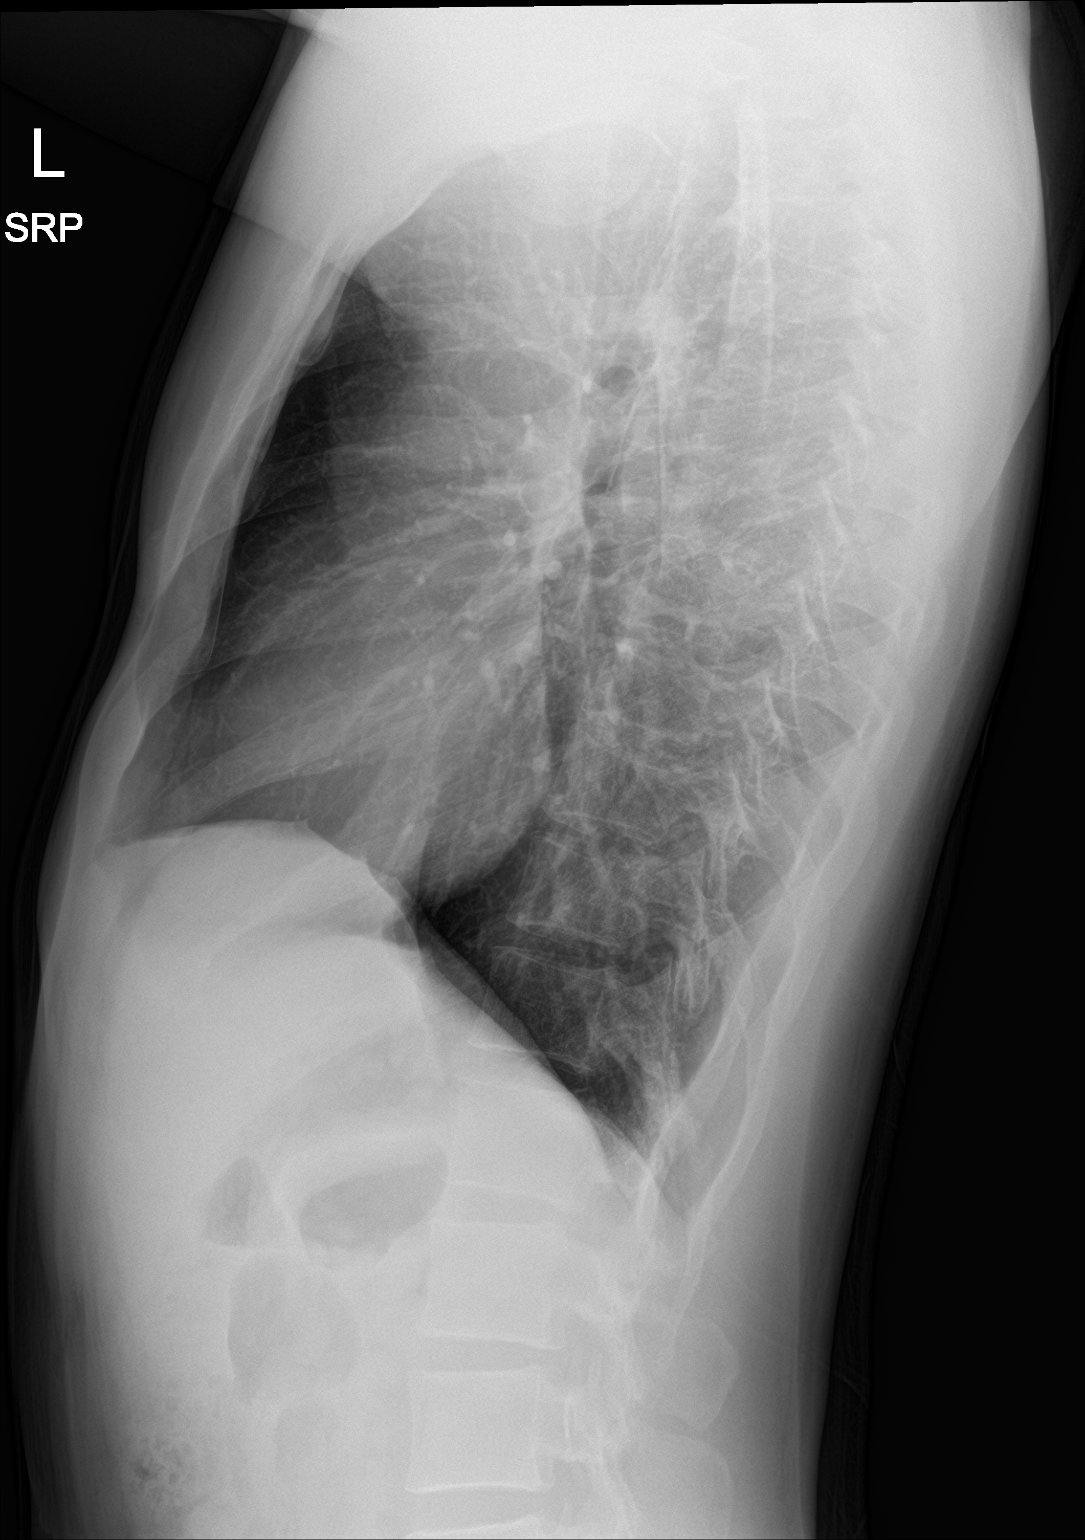

[2 of 2 positions shown; findings below may reference images not displayed]

FINDINGS: The heart size and mediastinal contours are within normal limits.
Both lungs are clear. The visualized skeletal structures are
unremarkable.
IMPRESSION: No active cardiopulmonary disease.

## 2021-04-24 ENCOUNTER — Emergency Department
Admission: EM | Admit: 2021-04-24 | Discharge: 2021-04-24 | Disposition: A | Discharge status: Home / Self Care | Payer: BC Managed Care – PPO | Attending: Emergency Medicine | Admitting: Emergency Medicine

## 2021-04-24 ENCOUNTER — Other Ambulatory Visit: Payer: Self-pay

## 2021-04-24 DIAGNOSIS — Z20822 Contact with and (suspected) exposure to covid-19: Secondary | ICD-10-CM | POA: Diagnosis not present

## 2021-04-24 DIAGNOSIS — R509 Fever, unspecified: Secondary | ICD-10-CM | POA: Diagnosis present

## 2021-04-24 DIAGNOSIS — F17210 Nicotine dependence, cigarettes, uncomplicated: Secondary | ICD-10-CM | POA: Diagnosis not present

## 2021-04-24 DIAGNOSIS — B349 Viral infection, unspecified: Secondary | ICD-10-CM | POA: Insufficient documentation

## 2021-04-24 LAB — RESP PANEL BY RT-PCR (FLU A&B, COVID) ARPGX2
Influenza A by PCR: POSITIVE — AB
Influenza B by PCR: NEGATIVE
SARS Coronavirus 2 by RT PCR: NEGATIVE

## 2021-04-24 MED ORDER — FLUTICASONE PROPIONATE 50 MCG/ACT NA SUSP: 1.0000 | Freq: Two times a day (BID) | NASAL | 0 refills | Status: DC

## 2021-04-24 MED ORDER — PREDNISONE 50 MG PO TABS: 50.0000 mg | ORAL_TABLET | Freq: Every day | ORAL | 0 refills | Status: DC

## 2021-04-24 MED ORDER — PSEUDOEPH-BROMPHEN-DM 30-2-10 MG/5ML PO SYRP: 10.0000 mL | ORAL_SOLUTION | Freq: Four times a day (QID) | ORAL | 0 refills | Status: DC | PRN

## 2021-04-24 NOTE — ED Notes (Signed)
ALL RX INFO PROVIDED , FOLLOW UP PCP INFO PROVIDED ALL QUESTIONS ANSWERED

## 2021-04-24 NOTE — ED Provider Notes (Signed)
[facility] Emergency Department Provider Note  ____________________________________________  Time seen: Approximately 10:15 PM  I have reviewed the triage vital signs and the nursing notes.   HISTORY  Chief Complaint No chief complaint on file.    HPI Troy Shields is a 35 y.o. male who presents the emergency department with a complaint of fevers and chills, headache, nasal congestion, sore throat, cough, body aches.  Patient has had symptoms for 2 days.  He states that his daughter was sick first, and then his girlfriend and then now he himself has become sick.  No visual changes, neck pain or stiffness, chest pain or shortness of breath.  No abdominal pain, nausea vomiting, diarrhea or constipation.         History reviewed. No pertinent past medical history.  There are no problems to display for this patient.   History reviewed. No pertinent surgical history.  Prior to Admission medications   Medication Sig Start Date End Date Taking? Authorizing Provider  brompheniramine-pseudoephedrine-DM 30-2-10 MG/5ML syrup Take 10 mLs by mouth 4 (four) times daily as needed. 04/24/21  Yes Kristia Jupiter, Delorise Royals, PA-C  fluticasone (FLONASE) 50 MCG/ACT nasal spray Place 1 spray into both nostrils 2 (two) times daily. 04/24/21  Yes Jackey Housey, Delorise Royals, PA-C  predniSONE (DELTASONE) 50 MG tablet Take 1 tablet (50 mg total) by mouth daily with breakfast. 04/24/21  Yes Loyda Costin, Delorise Royals, PA-C  albuterol (VENTOLIN HFA) 108 (90 Base) MCG/ACT inhaler Inhale 1-2 puffs into the lungs every 6 (six) hours as needed for wheezing or shortness of breath. 07/05/20   Wallis Bamberg, PA-C  benzonatate (TESSALON) 100 MG capsule Take 1-2 capsules (100-200 mg total) by mouth 3 (three) times daily as needed. 07/05/20   Wallis Bamberg, PA-C  cetirizine (ZYRTEC ALLERGY) 10 MG tablet Take 1 tablet (10 mg total) by mouth daily. 07/05/20   Wallis Bamberg, PA-C  doxycycline (VIBRAMYCIN) 100 MG capsule  Take 100 mg by mouth 2 (two) times daily. 02/29/20   [provider]    Allergies Patient has no known allergies.  Family History  Family history unknown: Yes    Social History Social History   Tobacco Use  . Smoking status: Current Every Day Smoker    Packs/day: 0.50    Types: Cigarettes  . Smokeless tobacco: Never Used  Substance Use Topics  . Alcohol use: Yes    Comment: occ  . Drug use: No     Review of Systems  Constitutional: Positive fever/chills.  Positive for body aches Eyes: No visual changes. No discharge ENT: Positive for nasal congestion and sore throat Cardiovascular: no chest pain. Respiratory: Positive cough. No SOB. Gastrointestinal: No abdominal pain.  No nausea, no vomiting.  No diarrhea.  No constipation. Musculoskeletal: Negative for musculoskeletal pain. Skin: Negative for rash, abrasions, lacerations, ecchymosis. Neurological: Negative for headaches, focal weakness or numbness.  10 System ROS otherwise negative.  ____________________________________________   PHYSICAL EXAM:  VITAL SIGNS: ED Triage Vitals  Enc Vitals Group     BP 04/24/21 2050 140/84     Pulse Rate 04/24/21 2050 (!) 106     Resp 04/24/21 2050 20     Temp 04/24/21 2050 99.9 F (37.7 C)     Temp Source 04/24/21 2050 Oral     SpO2 04/24/21 2050 97 %     Weight 04/24/21 2048 145 lb (65.8 kg)     Height 04/24/21 2048 5\' 11"  (1.803 m)     Head Circumference --  Peak Flow --      Pain Score 04/24/21 2048 8     Pain Loc --      Pain Edu? --      Excl. in [place]? --      Constitutional: Alert and oriented. Well appearing and in no acute distress. Eyes: Conjunctivae are normal. PERRL. EOMI. Head: Atraumatic. ENT:      Ears:       Nose: Mild congestion/rhinnorhea.      Mouth/Throat: Mucous membranes are moist.  Neck: No stridor.  Neck is supple full range of motion with no tenderness Hematological/Lymphatic/Immunilogical: No cervical  lymphadenopathy. Cardiovascular: Normal rate, regular rhythm. Normal S1 and S2.  Good peripheral circulation. Respiratory: Normal respiratory effort without tachypnea or retractions. Lungs CTAB. Good air entry to the bases with no decreased or absent breath sounds. Musculoskeletal: Full range of motion to all extremities. No gross deformities appreciated. Neurologic:  Normal speech and language. No gross focal neurologic deficits are appreciated.  Skin:  Skin is warm, dry and intact. No rash noted. Psychiatric: Mood and affect are normal. Speech and behavior are normal. Patient exhibits appropriate insight and judgement.   ____________________________________________   LABS (all labs ordered are listed, but only abnormal results are displayed)  Labs Reviewed  RESP PANEL BY RT-PCR (FLU A&B, COVID) ARPGX2 - Abnormal; Notable for the following components:      Result Value   Influenza A by PCR POSITIVE (*)    All other components within normal limits   ____________________________________________  EKG   ____________________________________________  RADIOLOGY   No results found.  ____________________________________________    PROCEDURES  Procedure(s) performed:    Procedures    Medications - No data to display   ____________________________________________   INITIAL IMPRESSION / ASSESSMENT AND PLAN / ED COURSE  Pertinent labs & imaging results that were available during my care of the patient were reviewed by me and considered in my medical decision making (see chart for details).  Review of the [place] CSRS was performed in accordance of the [facility] prior to dispensing any controlled drugs.           Patient's diagnosis is consistent with viral illness.  Patient presented to the emergency department with viral URI symptoms.  Patient's daughter first exhibited symptoms, then his girlfriend the patient himself.  There is no concerning signs and symptoms to warrant  imaging or labs other than COVID and influenza swab.  Patient will follow these results with MyChart.  Patient is given symptom control medications.  Return precautions discussed with the patient for concerning signs and symptoms to return to the ED for.  Otherwise follow-up primary care as needed.  Plenty of fluids, rest, Tylenol Motrin in addition to the prescribed medications..  Patient is given ED precautions to return to the ED for any worsening or new symptoms.     ____________________________________________  FINAL CLINICAL IMPRESSION(S) / ED DIAGNOSES  Final diagnoses:  Viral illness      NEW MEDICATIONS STARTED DURING THIS VISIT:  ED Discharge Orders         Ordered    predniSONE (DELTASONE) 50 MG tablet  Daily with breakfast        04/24/21 2216    brompheniramine-pseudoephedrine-DM 30-2-10 MG/5ML syrup  4 times daily PRN        04/24/21 2216    fluticasone (FLONASE) 50 MCG/ACT nasal spray  2 times daily        04/24/21 2216  This chart was dictated using voice recognition software/Dragon. Despite best efforts to proofread, errors can occur which can change the meaning. Any change was purely unintentional.    Racheal Patches, PA-C 04/24/21 2326    Chesley Noon, MD 04/27/21 [phone]

## 2021-04-24 NOTE — ED Triage Notes (Signed)
Pt states since Friday, he has been coughing and congested sore throat headache fever. Pt states his daughter is sick.

## 2021-05-17 ENCOUNTER — Encounter: Payer: Self-pay | Admitting: Physical Medicine and Rehabilitation

## 2021-07-28 ENCOUNTER — Encounter
Payer: BC Managed Care – PPO | Attending: Physical Medicine and Rehabilitation | Admitting: Physical Medicine and Rehabilitation

## 2021-10-06 ENCOUNTER — Encounter: Payer: BC Managed Care – PPO | Admitting: Physical Medicine and Rehabilitation

## 2021-10-09 ENCOUNTER — Encounter
Payer: BC Managed Care – PPO | Attending: Physical Medicine and Rehabilitation | Admitting: Physical Medicine and Rehabilitation

## 2022-05-08 ENCOUNTER — Encounter: Payer: Self-pay | Admitting: Intensive Care

## 2022-05-08 ENCOUNTER — Other Ambulatory Visit: Payer: Self-pay

## 2022-05-08 ENCOUNTER — Emergency Department: Payer: BC Managed Care – PPO

## 2022-05-08 ENCOUNTER — Emergency Department
Admission: EM | Admit: 2022-05-08 | Discharge: 2022-05-08 | Disposition: A | Discharge status: Home / Self Care | Payer: BC Managed Care – PPO | Attending: Emergency Medicine | Admitting: Emergency Medicine

## 2022-05-08 DIAGNOSIS — Z20822 Contact with and (suspected) exposure to covid-19: Secondary | ICD-10-CM | POA: Diagnosis not present

## 2022-05-08 DIAGNOSIS — R059 Cough, unspecified: Secondary | ICD-10-CM | POA: Diagnosis present

## 2022-05-08 DIAGNOSIS — M545 Low back pain, unspecified: Secondary | ICD-10-CM | POA: Insufficient documentation

## 2022-05-08 DIAGNOSIS — G8929 Other chronic pain: Secondary | ICD-10-CM | POA: Insufficient documentation

## 2022-05-08 DIAGNOSIS — J069 Acute upper respiratory infection, unspecified: Secondary | ICD-10-CM | POA: Diagnosis not present

## 2022-05-08 LAB — CBC WITH DIFFERENTIAL/PLATELET
Abs Immature Granulocytes: 0.01 10*3/uL (ref 0.00–0.07)
Basophils Absolute: 0 10*3/uL (ref 0.0–0.1)
Basophils Relative: 0 %
Eosinophils Absolute: 0 10*3/uL (ref 0.0–0.5)
Eosinophils Relative: 0 %
HCT: 43.9 % (ref 39.0–52.0)
Hemoglobin: 14.6 g/dL (ref 13.0–17.0)
Immature Granulocytes: 0 %
Lymphocytes Relative: 28 %
Lymphs Abs: 1.4 10*3/uL (ref 0.7–4.0)
MCH: 31.3 pg (ref 26.0–34.0)
MCHC: 33.3 g/dL (ref 30.0–36.0)
MCV: 94 fL (ref 80.0–100.0)
Monocytes Absolute: 0.5 10*3/uL (ref 0.1–1.0)
Monocytes Relative: 9 %
Neutro Abs: 3 10*3/uL (ref 1.7–7.7)
Neutrophils Relative %: 63 %
Platelets: 180 10*3/uL (ref 150–400)
RBC: 4.67 MIL/uL (ref 4.22–5.81)
RDW: 12.7 % (ref 11.5–15.5)
WBC: 4.9 10*3/uL (ref 4.0–10.5)
nRBC: 0 % (ref 0.0–0.2)

## 2022-05-08 LAB — RESP PANEL BY RT-PCR (FLU A&B, COVID) ARPGX2
Influenza A by PCR: NEGATIVE
Influenza B by PCR: NEGATIVE
SARS Coronavirus 2 by RT PCR: NEGATIVE

## 2022-05-08 LAB — BASIC METABOLIC PANEL
Anion gap: 8 (ref 5–15)
BUN: 8 mg/dL (ref 6–20)
CO2: 28 mmol/L (ref 22–32)
Calcium: 8.7 mg/dL — ABNORMAL LOW (ref 8.9–10.3)
Chloride: 101 mmol/L (ref 98–111)
Creatinine, Ser: 0.87 mg/dL (ref 0.61–1.24)
GFR, Estimated: >60 mL/min (ref >60)
Glucose, Bld: 136 mg/dL — ABNORMAL HIGH (ref 70–99)
Potassium: 3.9 mmol/L (ref 3.5–5.1)
Sodium: 137 mmol/L (ref 135–145)

## 2022-05-08 LAB — GROUP A STREP BY PCR: Group A Strep by PCR: NOT DETECTED

## 2022-05-08 NOTE — ED Notes (Addendum)
Pt to ED from [facility] for possible allergic reaction, pt had cortisone shot in back may 31st. Pt c/o headache, chills, weakness. Denies SOB

## 2022-05-08 NOTE — ED Triage Notes (Signed)
Patient c/o allergic reaction to cortisone shots he received on 04/25/22. Reports headaches, cough, chills and his back has been hurting

## 2022-05-08 NOTE — ED Provider Notes (Cosign Needed)
[facility] Provider Note    Event Date/Time   First MD Initiated Contact with Patient 05/08/22 1821     (approximate)   History   Allergic Reaction   HPI  GUILHERME SCHWENKE is a 36 y.o. male with history of chronic back pain presents emergency department complaining of feeling really bad since he had 2 cortisone injections into his lower spine.  Patient states he has had a cough and congestion and feels very tired.  No known fever.  No redness or swelling at the sites.  Still has back pain.      Physical Exam   Triage Vital Signs: ED Triage Vitals  Enc Vitals Group     BP 05/08/22 1752 (!) 143/81     Pulse Rate 05/08/22 1752 (!) 108     Resp 05/08/22 1752 16     Temp 05/08/22 1752 99.1 F (37.3 C)     Temp Source 05/08/22 1752 Oral     SpO2 05/08/22 1752 99 %     Weight 05/08/22 1753 145 lb (65.8 kg)     Height 05/08/22 1753 5\' 11"  (1.803 m)     Head Circumference --      Peak Flow --      Pain Score 05/08/22 1752 9     Pain Loc --      Pain Edu? --      Excl. in GC? --     Most recent vital signs: Vitals:   05/08/22 1752  BP: (!) 143/81  Pulse: (!) 108  Resp: 16  Temp: 99.1 F (37.3 C)  SpO2: 99%     General: Awake, no distress.   CV:  Good peripheral perfusion. regular rate and  rhythm Resp:  Normal effort. Lungs CTA Abd:  No distention.   Other:  No redness or swelling noted at injection sites of the lumbar spine, neurovascular is intact   ED Results / Procedures / Treatments   Labs (all labs ordered are listed, but only abnormal results are displayed) Labs Reviewed  BASIC METABOLIC PANEL - Abnormal; Notable for the following components:      Result Value   Glucose, Bld 136 (*)    Calcium 8.7 (*)    All other components within normal limits  RESP PANEL BY RT-PCR (FLU A&B, COVID) ARPGX2  GROUP A STREP BY PCR  CBC WITH DIFFERENTIAL/PLATELET     EKG     RADIOLOGY Chest  x-ray    PROCEDURES:   Procedures   MEDICATIONS ORDERED IN ED: Medications - No data to display   IMPRESSION / MDM / ASSESSMENT AND PLAN / ED COURSE  I reviewed the triage vital signs and the nursing notes.                              Differential diagnosis includes, but is not limited to, adrenal fatigue, URI, CAP, COVID, strep  Patient's presentation is most consistent with acute presentation with potential threat to life or bodily function.   Patient's labs are reassuring, chest x-ray is reassuring as interpreted by me as being negative for pneumonia.  I did explain all findings to the patient.  He did had a video visit with his physician from work covid prescribed amoxicillin, promethazine cough syrup, and trazodone to help him sleep.  He is to continue his medications as previously prescribed.  Follow-up with [facility] who he has been seen for his  back pain.  Return emergency department if worsening.  Patient is in agreement treatment plan.  Discharged stable condition.     FINAL CLINICAL IMPRESSION(S) / ED DIAGNOSES   Final diagnoses:  Acute URI  Acute exacerbation of chronic low back pain     Rx / DC Orders   ED Discharge Orders     None        Note:  This document was prepared using Dragon voice recognition software and may include unintentional dictation errors.    Faythe Ghee, PA-C 05/08/22 1928

## 2022-05-08 NOTE — Discharge Instructions (Signed)
Follow-up with [facility].  Please call for an appointment.  Take your medications as prescribed.  You may also take Tylenol for pain if needed.  Apply ice pack to your lower back.  Try Lidoderm patch as this may also help your lower back.

## 2022-05-10 ENCOUNTER — Emergency Department
Admission: EM | Admit: 2022-05-10 | Discharge: 2022-05-10 | Disposition: A | Discharge status: Home / Self Care | Payer: BC Managed Care – PPO | Attending: Emergency Medicine | Admitting: Emergency Medicine

## 2022-05-10 ENCOUNTER — Encounter: Payer: Self-pay | Admitting: Emergency Medicine

## 2022-05-10 ENCOUNTER — Other Ambulatory Visit: Payer: Self-pay

## 2022-05-10 DIAGNOSIS — M5440 Lumbago with sciatica, unspecified side: Secondary | ICD-10-CM

## 2022-05-10 DIAGNOSIS — M545 Low back pain, unspecified: Secondary | ICD-10-CM | POA: Diagnosis present

## 2022-05-10 MED ORDER — KETOROLAC TROMETHAMINE 30 MG/ML IJ SOLN
30.0000 mg | Freq: Once | INTRAMUSCULAR | Status: AC
  Administered 2022-05-10: 30 mg via INTRAMUSCULAR
  Filled 2022-05-10: qty 1

## 2022-05-10 MED ORDER — LIDOCAINE 5 % EX PTCH
1.0000 | MEDICATED_PATCH | CUTANEOUS | Status: DC
  Administered 2022-05-10: 1 via TRANSDERMAL
  Filled 2022-05-10: qty 1

## 2022-05-10 NOTE — ED Provider Notes (Signed)
[facility] Provider Note    Event Date/Time   First MD Initiated Contact with Patient 05/10/22 [phone]     (approximate)   History   Back Pain   HPI  Troy Shields is a 36 y.o. male presents to the ED with complaint of low back pain without history of injury.  Patient initially was seen at [facility] on May 31 at which time he states that he was given steroid injections in his back and also a prescription for Medrol, meloxicam and Zanaflex.  Patient states that he took it for a couple of days and did not seem to be helping so he discontinued all the above medications.  He also states he only went for 2 sessions of physical therapy because this did not seem to be giving him any pain relief.  He is trying to be seen at [facility] today but states that he cannot get an appointment even on the walk inside of the clinic.     Physical Exam   Triage Vital Signs: ED Triage Vitals  Enc Vitals Group     BP 05/10/22 0833 (!) 147/94     Pulse Rate 05/10/22 0833 (!) 110     Resp 05/10/22 0833 18     Temp 05/10/22 0834 98.6 F (37 C)     Temp Source 05/10/22 0834 Oral     SpO2 05/10/22 0833 98 %     Weight 05/10/22 0834 145 lb 1 oz (65.8 kg)     Height 05/10/22 0834 5\' 11"  (1.803 m)     Head Circumference --      Peak Flow --      Pain Score 05/10/22 0833 10     Pain Loc --      Pain Edu? --      Excl. in GC? --     Most recent vital signs: Vitals:   05/10/22 0833 05/10/22 0834  BP: (!) 147/94   Pulse: (!) 110   Resp: 18   Temp:  98.6 F (37 C)  SpO2: 98%      General: Awake, no distress.  Patient is able to ambulate without any assistance.  He is noted to be lying from his waist up on the stretcher and hanging his feet off the stretcher onto the floor. CV:  Good peripheral perfusion.  Resp:  Normal effort.  Abd:  No distention.  Other:  On examination of the lower lumbar area there is no gross deformity however there is tenderness on palpation of  the lower L5-S1 area and bilateral paravertebral muscles.  Range of motion is minimally restricted but does increase his pain.  Straight leg raises are negative.  Skin is intact.    ED Results / Procedures / Treatments   Labs (all labs ordered are listed, but only abnormal results are displayed) Labs Reviewed - No data to display    PROCEDURES:  Critical Care performed:   Procedures   MEDICATIONS ORDERED IN ED: Medications  lidocaine (LIDODERM) 5 % 1 patch (1 patch Transdermal Patch Applied 05/10/22 1001)  ketorolac (TORADOL) 30 MG/ML injection 30 mg (30 mg Intramuscular Given 05/10/22 1001)     IMPRESSION / MDM / ASSESSMENT AND PLAN / ED COURSE  I reviewed the triage vital signs and the nursing notes.   Differential diagnosis includes, but is not limited to, lumbar pain, lumbar strain, degenerative disc disease, disc bulge, muscle spasms.  36 year old male presents to the ED with complaint of low back  pain with occasional radiation into his lower extremities.  Patient states that he was seen at [facility] on 04/25/2022 and did not get any relief from the steroid injection or the medications that were prescribed.  He states he was told to come to the emergency department if his pain was worsening.  MRI that was done at [facility] on 02/27/2022 was reviewed and shows a diffuse disc bulge at L5-S1 with central disc protrusion.  Moderate bilateral foraminal narrowing exiting L5 nerve root secondary to disc bulge.  I called [facility] and receptionist confirmed that the walk-in clinic does not require an appointment.  Patient was made aware.  He agreed to a Toradol injection 30 mg IM and a Lidoderm patch to his lower back until he can be seen at [facility].      Patient's presentation is most consistent with acute, uncomplicated illness.  FINAL CLINICAL IMPRESSION(S) / ED DIAGNOSES   Final diagnoses:  Bilateral low back pain with sciatica, sciatica laterality unspecified, unspecified  chronicity     Rx / DC Orders   ED Discharge Orders     None        Note:  This document was prepared using Dragon voice recognition software and may include unintentional dictation errors.   Tommi Rumps, PA-C 05/10/22 1443    Minna Antis, MD 05/10/22 1539

## 2022-05-10 NOTE — ED Notes (Signed)
See triage note.Presents with cont's lower back pain. States he was seen at [facility] and had MRI done  then went to [facility] ..was evaluated and then given cortisone shot   states he has felt bad since  cont's having back pain and headaches

## 2022-05-10 NOTE — ED Triage Notes (Signed)
Pt here with back pain. Pt states he received a cortisone injection on 04/25/22 and has been dealing with the pain ever since.

## 2022-05-10 NOTE — Discharge Instructions (Signed)
Go to the walk-in clinic at [facility] for follow-up of your continued back pain.  An injection of ketorolac 30 mg was given while in the ED and a Lidoderm 5% patch was applied.

## 2024-01-31 ENCOUNTER — Encounter (HOSPITAL_COMMUNITY): Payer: Self-pay

## 2024-01-31 ENCOUNTER — Ambulatory Visit (INDEPENDENT_AMBULATORY_CARE_PROVIDER_SITE_OTHER)

## 2024-01-31 ENCOUNTER — Ambulatory Visit (HOSPITAL_COMMUNITY)
Admission: EM | Admit: 2024-01-31 | Discharge: 2024-01-31 | Disposition: A | Discharge status: Home / Self Care | Payer: Self-pay | Attending: Emergency Medicine | Admitting: Emergency Medicine

## 2024-01-31 DIAGNOSIS — M25562 Pain in left knee: Secondary | ICD-10-CM

## 2024-01-31 MED ORDER — IBUPROFEN 800 MG PO TABS: 800.0000 mg | ORAL_TABLET | Freq: Three times a day (TID) | ORAL | 0 refills | Status: AC

## 2024-01-31 NOTE — ED Provider Notes (Signed)
 [facility]    CSN: [id number] Arrival date & time: 01/31/24  [id number]      History   Chief Complaint Chief Complaint  Patient presents with   Knee Pain    HPI Troy Shields is a 38 y.o. male.  2-week history of left knee pain. Worse with bending and stairs  Denies known injury, trauma, fall. Works on his feet constantly.  No numbness/tingling or weakness in the leg  Has tried wearing a brace that helps temporarily No medications taken   History of left patellofemoral pain syndrome.  Has seen orthopedics in the past. Family has arthritis, he would like xray today to check   History reviewed. No pertinent past medical history.  There are no active problems to display for this patient.   History reviewed. No pertinent surgical history.     Home Medications    Prior to Admission medications   Medication Sig Start Date End Date Taking? Authorizing Provider  ibuprofen (ADVIL) 800 MG tablet Take 1 tablet (800 mg total) by mouth 3 (three) times daily. 01/31/24  Yes Jessicca Stitzer, Lurena Joiner, PA-C    Family History Family History  Family history unknown: Yes    Social History Social History   Tobacco Use   Smoking status: Every Day    Types: Cigars   Smokeless tobacco: Never  Vaping Use   Vaping status: Never Used  Substance Use Topics   Alcohol use: Yes    Alcohol/week: 1.0 standard drink of alcohol    Types: 1 Shots of liquor per week   Drug use: Yes    Types: Marijuana     Allergies   Patient has no known allergies.   Review of Systems Review of Systems Per HPI  Physical Exam Triage Vital Signs ED Triage Vitals  Encounter Vitals Group     BP 01/31/24 1019 (!) 146/91     Systolic BP Percentile --      Diastolic BP Percentile --      Pulse Rate 01/31/24 1019 (!) 54     Resp 01/31/24 1019 16     Temp 01/31/24 1019 (!) 97.3 F (36.3 C)     Temp Source 01/31/24 1019 Oral     SpO2 01/31/24 1019 96 %     Weight --      Height 01/31/24 1019 5'  11" (1.803 m)     Head Circumference --      Peak Flow --      Pain Score 01/31/24 1017 8     Pain Loc --      Pain Education --      Exclude from Growth Chart --    No data found.  Updated Vital Signs BP (!) 146/91 (BP Location: Left Arm)   Pulse 60   Temp (!) 97.3 F (36.3 C) (Oral)   Resp 16   Ht 5\' 11"  (1.803 m)   SpO2 96%   BMI 20.23 kg/m   Physical Exam Vitals and nursing note reviewed.  Constitutional:      General: He is not in acute distress. HENT:     Mouth/Throat:     Pharynx: Oropharynx is clear.  Cardiovascular:     Rate and Rhythm: Normal rate and regular rhythm.     Pulses: Normal pulses.     Heart sounds: Normal heart sounds.  Pulmonary:     Effort: Pulmonary effort is normal.     Breath sounds: Normal breath sounds.  Musculoskeletal:     Left  knee: No swelling, deformity, effusion, bony tenderness or crepitus. Normal range of motion. Normal patellar mobility.     Comments: Distal sensation intact. Strong DP pulse.  Skin:    General: Skin is warm and dry.     Capillary Refill: Capillary refill takes less than 2 seconds.  Neurological:     Mental Status: He is alert and oriented to person, place, and time.     Gait: Gait normal.     Comments: Normal gait    UC Treatments / Results  Labs (all labs ordered are listed, but only abnormal results are displayed) Labs Reviewed - No data to display  EKG  Radiology DG Knee 2 Views Left Result Date: 01/31/2024 CLINICAL DATA:  Pain for 2 weeks.  No known injury. EXAM: LEFT KNEE - 1-2 VIEW COMPARISON:  None Available. FINDINGS: No evidence of fracture, dislocation, or joint effusion. Normal alignment and joint spaces. No evidence of arthropathy or other focal bone abnormality. Soft tissues are unremarkable. IMPRESSION: Negative radiographs of the left knee. Electronically Signed   By: Narda Rutherford M.D.   On: 01/31/2024 12:26    Procedures Procedures   Medications Ordered in UC Medications - No data  to display  Initial Impression / Assessment and Plan / UC Course  I have reviewed the triage vital signs and the nursing notes.  Pertinent labs & imaging results that were available during my care of the patient were reviewed by me and considered in my medical decision making (see chart for details).  Left knee xray negative. Discussed RICE therapy, pain control with ibuprofen, back to orthopedics for follow up. Has also been scheduled by CMA for a PCP appointment, which is next month. Work note is provided. Return precautions, patient agrees to plan  Final Clinical Impressions(s) / UC Diagnoses   Final diagnoses:  Acute pain of left knee     Discharge Instructions      I will call you if there is anything abnormal on xray  Rest - try to avoid heavy lifting and high impact activity Ice - apply for 20 minutes a few times daily Compression - use knee brace when walking/standing  Elevation - prop up on a pillow Ibuprofen - can be used every 6 hours for pain/inflammation  Please follow up with orthopedics   Staff has also scheduled you a primary care appointment for April 22nd. Details below. You can call them to reschedule if needed.      ED Prescriptions     Medication Sig Dispense Auth. Provider   ibuprofen (ADVIL) 800 MG tablet Take 1 tablet (800 mg total) by mouth 3 (three) times daily. 21 tablet Iyonnah Ferrante, Lurena Joiner, PA-C      PDMP not reviewed this encounter.   Jeanluc Wegman, Lurena Joiner, [place] 01/31/24 1245

## 2024-01-31 NOTE — Discharge Instructions (Addendum)
 I will call you if there is anything abnormal on xray  Rest - try to avoid heavy lifting and high impact activity Ice - apply for 20 minutes a few times daily Compression - use knee brace when walking/standing  Elevation - prop up on a pillow Ibuprofen - can be used every 6 hours for pain/inflammation  Please follow up with orthopedics   Staff has also scheduled you a primary care appointment for April 22nd. Details below. You can call them to reschedule if needed.

## 2024-01-31 NOTE — ED Triage Notes (Signed)
 Patient presenting with left knee pain onset about 2 weeks ago. No known falls or injuries. States arthritis  runs in the family  Prescriptions or OTC medications tried: Yes- Knee brace    with mild relief.

## 2024-02-11 ENCOUNTER — Telehealth: Payer: Self-pay | Admitting: General Practice

## 2024-02-11 NOTE — Telephone Encounter (Signed)
 Would you want to try and work this NP in sooner or advise UC for knee pain?

## 2024-02-11 NOTE — Telephone Encounter (Signed)
 Please let patient know he can not be seen sooner with Modesto Charon, NP; unless another provider can take him on sooner as a NP.

## 2024-02-11 NOTE — Telephone Encounter (Signed)
 Copied from CRM [id number]. Topic: General - Other >> Feb 11, 2024  1:47 PM Aletta Edouard wrote: Reason for CRM: patient  is calling in needing to be seen sooner then what he is scheduled for his knee is bothering him he is off  Wednesday Thursday

## 2024-02-12 NOTE — Telephone Encounter (Signed)
 [id number]

## 2024-03-17 ENCOUNTER — Ambulatory Visit: Payer: Self-pay | Admitting: General Practice
